# Patient Record
Sex: Female | Born: 1970
Health system: Western US, Academic
[De-identification: ages and names within clinical notes are randomized; demographics above are authoritative.]

---

## 2020-02-21 ENCOUNTER — Inpatient Hospital Stay: Payer: Self-pay

## 2020-02-21 DIAGNOSIS — M25561 Pain in right knee: Secondary | ICD-10-CM

## 2020-02-21 DIAGNOSIS — M25562 Pain in left knee: Secondary | ICD-10-CM

## 2020-02-21 DIAGNOSIS — G8929 Other chronic pain: Secondary | ICD-10-CM

## 2023-03-28 ENCOUNTER — Inpatient Hospital Stay
Admit: 2023-03-28 | Discharge: 2023-03-29 | Disposition: A | Discharge status: Nursing Home (Includes ICF) | Payer: BLUE CROSS/BLUE SHIELD | Source: Home / Self Care

## 2023-03-28 ENCOUNTER — Ambulatory Visit: Payer: BLUE CROSS/BLUE SHIELD

## 2023-03-28 DIAGNOSIS — N12 Tubulo-interstitial nephritis, not specified as acute or chronic: Secondary | ICD-10-CM

## 2023-03-28 LAB — Glucose,POC: GLUCOSE,POC: 100 mg/dL — ABNORMAL HIGH (ref 65–99)

## 2023-03-28 LAB — Glucose, Whole Blood: GLUCOSE, WHOLE BLOOD: 96 mg/dL (ref 65–99)

## 2023-03-28 LAB — CBC: HEMOGLOBIN: 11.8 g/dL (ref 11.6–15.2)

## 2023-03-28 LAB — CREATININE: ESTIMATED GFR 2021 CKD-EPI: 59 mL/min/{1.73_m2} (ref 0.60–1.30)

## 2023-03-28 MED ADMIN — SODIUM CHLORIDE 0.9 % IV BOLUS: 1000 mL | INTRAVENOUS | Stop: 2023-03-29 | NDC 00338004904

## 2023-03-28 MED ADMIN — ONDANSETRON 4 MG PO TBDP: 4 mg | ORAL | @ 23:00:00 | Stop: 2023-03-28 | NDC 57237007710

## 2023-03-28 NOTE — ED Provider Notes
[facility]  Emergency Department Service Report    Triage     Jhane Vanderzwaag, a 52 y.o. female, presents with Emesis (For a month)    Arrived on 03/28/2023 at 3:02 PM   Arrived by Walk-in [14]    ED Triage Vitals   Temp Temp Source BP Heart Rate Resp SpO2 O2 Device Pain Score Weight   03/28/23 1506 03/28/23 1506 03/28/23 1506 03/28/23 1506 03/28/23 1506 03/28/23 1506 03/29/23 0027 03/28/23 1506 03/28/23 1508   36.7 ?C (98.1 ?F) Oral 123/74 (!) 109 16 98 % None (Room air) Five 94.3 kg (208 lb)       No Known Allergies     Initial Physician Contact       Comprehensive Exam Initiated  Contact Date: 03/28/23  Contact Time: 52    History   52 year old female with a history of cholecystectomy presenting with abdominal pain, vomiting and fatigue.  The patient states the abdominal pain and vomiting started 1 month ago.  She was seen at an outside hospital and diagnosed with the UTI.  She finished her antibiotics and has only felt worse.  Now she has extreme fatigue and diarrhea associated with this.  She has subjective fevers as well.  Patient denies chest pain, shortness of breath.    The history is provided by the patient, medical records and a relative.       Financial risk analyst Used?: Patient declined (daughter interpreting for patient)               Past Medical History:   Diagnosis Date    Hypertension         Past Surgical History:   Procedure Laterality Date    CHOLECYSTECTOMY          Past Family History   family history is not on file.     Past Social History   she has no history on file for tobacco use, alcohol use, drug use, and sexual activity.       Physical Exam   Physical Exam  Vitals and nursing note reviewed.   Constitutional:       Appearance: Normal appearance.   HENT:      Head: Normocephalic and atraumatic.      Nose: Nose normal.      Mouth/Throat:      Mouth: Mucous membranes are moist.      Pharynx: Oropharynx is clear.   Eyes:      Extraocular Movements: Extraocular movements intact.      Conjunctiva/sclera: Conjunctivae normal.      Pupils: Pupils are equal, round, and reactive to light.   Cardiovascular:      Rate and Rhythm: Normal rate and regular rhythm.      Pulses: Normal pulses.      Heart sounds: Normal heart sounds.   Pulmonary:      Effort: Pulmonary effort is normal. No respiratory distress.      Breath sounds: Normal breath sounds.   Abdominal:      General: Abdomen is flat. Bowel sounds are normal.      Palpations: Abdomen is soft.      Tenderness: There is abdominal tenderness (Diffuse). There is no guarding or rebound.   Musculoskeletal:         General: Normal range of motion.      Cervical back: Normal range of motion.      Right lower leg: No edema.      Left lower  leg: No edema.   Skin:     General: Skin is warm and dry.      Capillary Refill: Capillary refill takes less than 2 seconds.      Findings: Rash (bilateral upper extremities) present.   Neurological:      General: No focal deficit present.      Mental Status: She is alert and oriented to person, place, and time.   Psychiatric:         Mood and Affect: Mood normal.         Behavior: Behavior normal.           Medical Decision Making   Jaemarie Kovatch is a 52 y.o. female subacute abdominal pain, vomiting. Differential diagnosis includes but is not limited to:  Gastritis, viral infection, persistent UTI, colitis, diverticulitis, atypical ACS, arrhythmia, drug reaction, electrolyte abnormality, dehydration.  Plan:  Labs, urinalysis, CT abdomen pelvis, EKG, IV fluids, symptom control.    Medical Decision Making  Amount and/or Complexity of Data Reviewed  Labs: ordered. Decision-making details documented in ED Course.  Radiology: ordered.  ECG/medicine tests: ordered.    Risk  Prescription drug management.  Decision regarding hospitalization.        ED Course as of 04/02/23 2336   Sun Mar 28, 2023   1707 1652 EKG normal sinus rhythm, rate 92, normal intervals, no STE/D, no TWI, inferior Q waves [SK]   1725 Potassium(!): 3.4  Repletion ordered.  [SK]   2118 IMPRESSION:     1.  No acute CT abnormality in the abdomen or pelvis. Normal appendix. No bowel obstruction.  2.  Hepatic steatosis.   [LC]   Mon Mar 29, 2023   0530 UA consistent with pyelonephritis. Failed treatment with oral antibiotics. Persistently tachycardic. Plan to transfer for inpatient admission at OSH.  [LC]   0555 Pending transfer to OSH. Here w/ pyelo. Transfer cert in.  [KM]      ED Course User Index  [KM] Unknown Jim., MD  [LC] Cresenciano Lick., MD  [SK] Sydnee Cabal., MD, MPH       ED Course      Laboratory Results     Labs Reviewed   UA,DIPSTICK - Abnormal; Notable for the following components:       Result Value    Specific Gravity 1.043 (*)     Ketones 4+ (*)     Protein Trace (*)     Leukocyte Esterase 3+ (*)     All other components within normal limits   UA,MICROSCOPIC - Abnormal; Notable for the following components:    WBC per uL >1,000 (*)     WBC per HPF >200 (*)     Bacteria Present (*)     Squamous Epi Cells 222 (*)     All other components within normal limits   CBC - Abnormal; Notable for the following components:    Mean Corpuscular Volume 78.8 (*)     Mean Corpuscular Hemoglobin 24.8 (*)     Red Cell Distribution Width-SD 53.0 (*)     Red Cell Distribution Width-CV 18.6 (*)     All other components within normal limits   ELECTROLYTE PANEL - Abnormal; Notable for the following components:    Sodium 132 (*)     Potassium 3.4 (*)     Chloride 92 (*)     All other components within normal limits   HEPATIC FUNCT PANEL - Abnormal; Notable for the following components:  Albumin 3.8 (*)     All other components within normal limits   UA,DIPSTICK - Abnormal; Notable for the following components:    Specific Gravity 1.054 (*)     Ketones 4+ (*)     Protein Trace (*)     Leukocyte Esterase 1+ (*)     All other components within normal limits   UA,MICROSCOPIC - Abnormal; Notable for the following components: WBC per uL 65 (*)     WBC per HPF 12 (*)     Bacteria Present (*)     Squamous Epi Cells 30 (*)     All other components within normal limits   POCT GLUCOSE - Abnormal; Notable for the following components:    Glucose, Point of Care 100 (*)     All other components within normal limits   PREGNANCY TEST,URINE - Normal   GLUCOSE - Normal   LIPASE - Normal   TSH WITH REFLEX FT4, FT3 - Normal   HS TROPONIN I + REFLEX IF >= 5 NG/L - Normal   PREGNANCY TEST,BLOOD - Normal   EXPEDITED COVID-19 AND INFLUENZA A B PCR, RESPIRATORY UPPER   URINALYSIS,ROUTINE    Narrative:     The following orders were created for panel order Urinalysis Routine (ED Abdominal Pain/Nausea/Vomiting: Nurse Protocol).  Procedure                               Abnormality         Status                     ---------                               -----------         ------                     UA,Dipstick[[id number]]                  Abnormal            Final result               UA,Microscopic[[id number]]               Abnormal            Final result                 Please view results for these tests on the individual orders.   CREATININE,WHOLE BLOOD   URINALYSIS,ROUTINE    Narrative:     The following orders were created for panel order Urinalysis (UA).  Procedure                               Abnormality         Status                     ---------                               -----------         ------                     UA,Dipstick[[id number]]  Abnormal            Final result               UA,Microscopic[[id number]]               Abnormal            Final result                 Please view results for these tests on the individual orders.       Imaging Results     CT abd+pelvis w contrast   Final Result by Ledon Snare., DO (06/09 2039)   IMPRESSION:      1.  No acute CT abnormality in the abdomen or pelvis. Normal appendix. No bowel obstruction.   2.  Hepatic steatosis.            Signed by: Ledon Snare   03/28/2023 8:39 PM Consults     Consult Orders Placed This Encounter       None            Clnical Impression        1. Pyelonephritis           Disposition and Follow-up   Disposition: Transfer to Another Facility [2]     No future appointments.    Follow up with:  No follow-up provider specified.    Return precautions are specified on After Visit Summary.    Discharge Medication List as of 03/29/2023  7:36 AM          Medications Administered This Encounter           Status .     cefTRIAXone 1 g in dextrose 50 mL IVPB MB  STAT         Last MAR action: Stopped      sodium chloride 0.9% IV soln bolus 1,000 mL  Once         Last MAR action: Stopped      ondansetron 4 mg/2 mL inj 4 mg  Once         Last MAR action: Given      iohexol (Omnipaque) 350 mg/mL inj 100 mL  Once         Last MAR action: Given      sodium chloride 0.9% IV soln bolus 1,000 mL  Once         Last MAR action: Stopped      ondansetron ODT tab disolv 4 mg  Once         Last MAR action: Given             Orthoptist   I, Earlean Polka , have acted as a Stage manager for patient Dell Debs on behalf of Dr. Adriana Simas at 03/29/2023 at 1:18 AM. All documentation underwent a comprehensive review by the listed physician(s) and received their approval upon signing.      Resident Signature        Cresenciano Lick., MD  Resident  03/29/23 0532    ATTENDING NOTE    I was present with the resident during the key/critical portions of this service.  I have discussed the management with the resident, have reviewed the resident note and agree with the documented findings and plan of care.    Redge Gainer, MD      Sydnee Cabal., MD, MPH  04/02/23 [phone]

## 2023-03-28 NOTE — ED Notes
Carpal spasms have resolved, RR decreased, pt medicated with Zofran ODT. Awaits ERMD eval.

## 2023-03-28 NOTE — ED Notes
Family called reporting pt feeling short of breath. First contact with pt , pt hyperventilating with carpal spasm on left hand.  Coaching done to help slow down breathing. Pt speaking in full sentences, Pt noted to have rash to bilateral upper arms x 2 weeks. Pt dry heaving. No chest pain or other pain. Family with pt.

## 2023-03-29 LAB — UA,Dipstick: PH,URINE: 7 (ref 5.0–8.0)

## 2023-03-29 LAB — UA,Microscopic
RBCS: 11 {cells}/uL (ref 0–11)
SQUAMOUS EPITHELIAL CELLS: 30 {cells}/uL — ABNORMAL HIGH (ref 0–17)

## 2023-03-29 LAB — Expedited COVID-19 and Influenza A B PCR: INFLUENZA B PCR: NOT DETECTED

## 2023-03-29 LAB — Electrolyte Panel: POTASSIUM: 3.4 mmol/L — ABNORMAL LOW (ref 3.6–5.3)

## 2023-03-29 LAB — Lipase: LIPASE: 30 U/L (ref 13–69)

## 2023-03-29 LAB — TSH with reflex FT4, FT3: TSH: 1.1 u[IU]/mL (ref 0.3–4.7)

## 2023-03-29 LAB — Pregnancy Test,Blood: PREGNANCY TEST,BLOOD: NEGATIVE

## 2023-03-29 LAB — HS Troponin I + Reflex If >=  5 ng/L: HIGH SENSITIVITY TROPONIN I: 4 ng/L (ref <5)

## 2023-03-29 LAB — Hepatic Funct Panel: ALBUMIN FORHEPFUNCTPNL: 3.8 g/dL — ABNORMAL LOW (ref 3.9–5.0)

## 2023-03-29 LAB — Pregnancy Test,Urine: PREGNANCY TEST,URINE: NEGATIVE

## 2023-03-29 MED ADMIN — SODIUM CHLORIDE 0.9 % IV BOLUS: 1000 mL | INTRAVENOUS | @ 04:00:00 | Stop: 2023-03-29 | NDC 00338004904

## 2023-03-29 MED ADMIN — CEFTRIAXONE SODIUM-DEXTROSE 1-3.74 GM-%(50ML) IV SOLR: 1 g | INTRAVENOUS | @ 07:00:00 | Stop: 2023-03-29 | NDC 00264315311

## 2023-03-29 MED ADMIN — ONDANSETRON HCL 4 MG/2ML IJ SOLN: 4 mg | INTRAVENOUS | @ 04:00:00 | Stop: 2023-03-29 | NDC 60505613000

## 2023-03-29 MED ADMIN — IOHEXOL 350 MG/ML IV SOLN: 100 mL | INTRAVENOUS | @ 04:00:00 | Stop: 2023-03-29 | NDC 00407141491

## 2023-03-29 MED ADMIN — POTASSIUM CHLORIDE 20 MEQ/15ML (10%) PO SOLN: 40 meq | ORAL | @ 01:00:00 | Stop: 2023-03-29

## 2023-03-29 NOTE — Nursing Note
Report called to [facility] [place] [phone] ext [phone]. Report given to [facility]. ETA time for pick up 0500.    0500: New pick up time is now 0630-0700.

## 2023-03-29 NOTE — ED Notes
Did not physically received patient. RN Sheran Lawless will give report to [facility] RN.

## 2023-03-29 NOTE — Nursing Note
Pt picked up by EMT via stretcher. All belongings given back to pt. Pt is A&O x 4, on RA, respirations even and unlabored. Report given to EMT. Pt left in stable condition.   Last Recorded Vital Signs:    03/29/23 0725   BP: 118/59   Pulse: 96   Resp: 18   Temp: 36.5 ?C (97.7 ?F)   SpO2: 96%

## 2023-03-29 NOTE — Consults
Per ED MD, pt stable to transfer to in network facility   Spoke to [facility] regarding clinical information. Belinda to pass on to Dr. Verdie Mosher and call CM back.  Called Massie Bougie [phone]) regarding peer to peer. Belinda to follow up with Dr. Verdie Mosher and call CM back   Per Massie Bougie, Dr. Verdie Mosher attempted to do peer to peer but was unsuccessful. CM to reinitiate peer to peer   Peer to peer initiated with ED MD and Dr. Verdie Mosher [phone]). Per Dr. Verdie Mosher, pt capitated to [facility], pt accepted.   C

## 2024-09-14 IMAGING — MR JOELHO ESQUERDO
1 series · 10 of 10 positions shown · non-contrast
Comparison: none

[Series 10: [id number]_fs · left · 10 of 22 frames shown]
[frame 1/22]
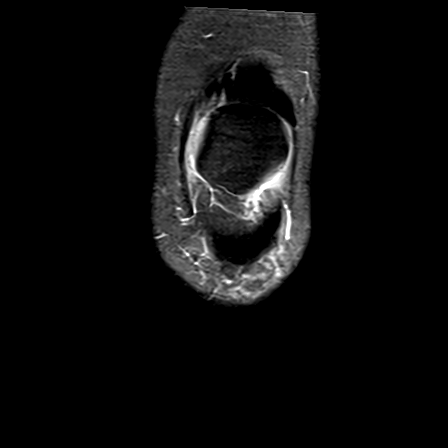
[frame 3/22]
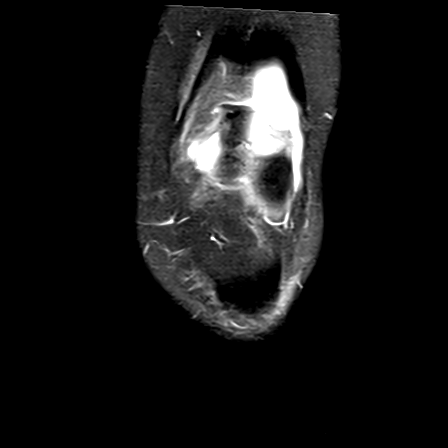
[frame 5/22]
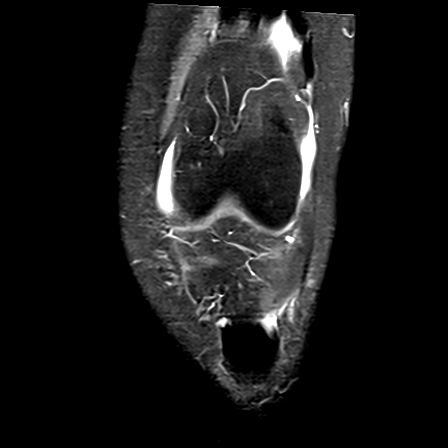
[frame 8/22]
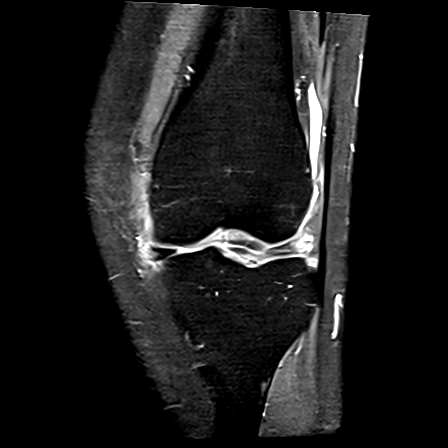
[frame 10/22]
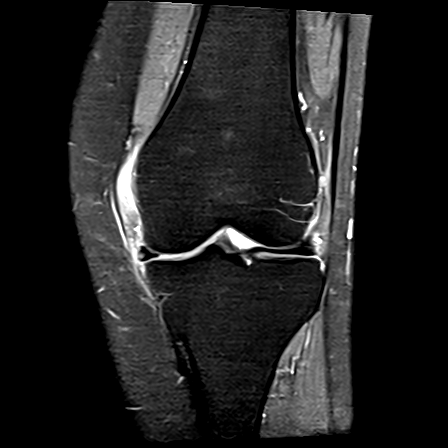
[frame 12/22]
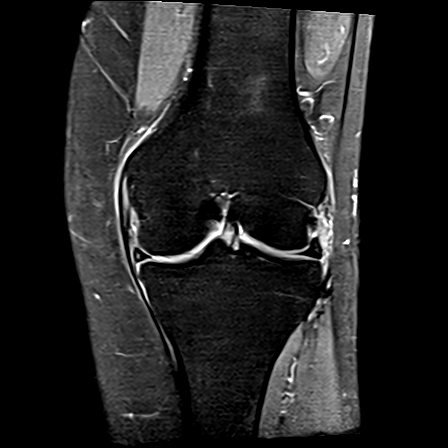
[frame 15/22]
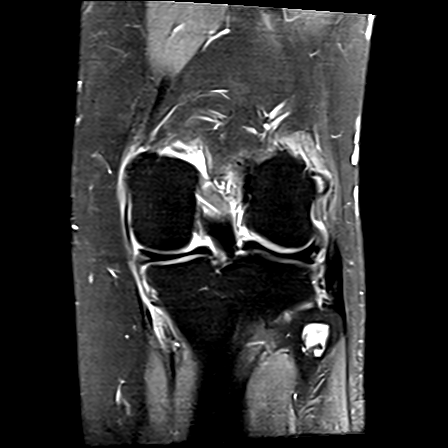
[frame 17/22]
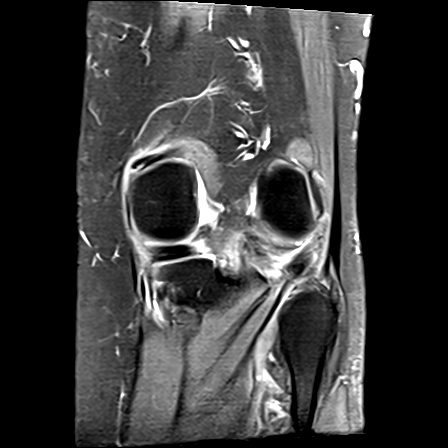
[frame 19/22]
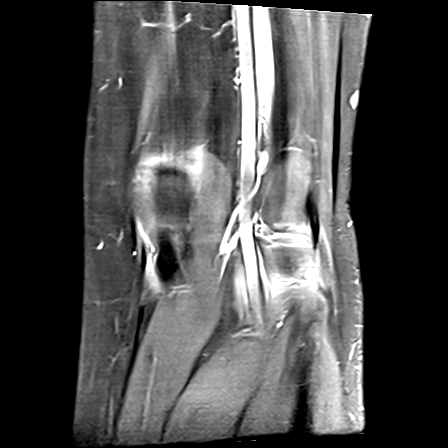
[frame 22/22]
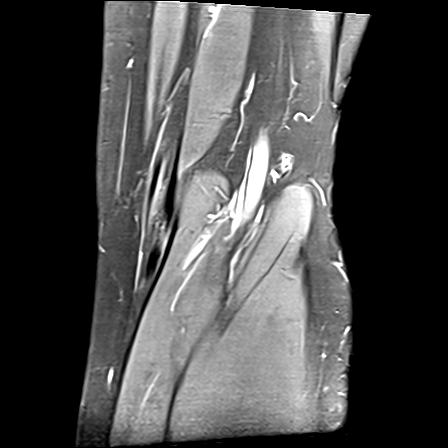

[10 of 10 positions shown; findings below may reference images not displayed]

Solicitante: OMOYINOLUWA TOBRE
Método:
Ressonância magnética realizada com sequências FSE em T1 e T2. Planos de cortes múltiplos.
Análise:
Sinais  de  estiramento  do  ligamento  colateral  medial  no  seu  terço  proximal,  com  alteração  de  sinal  e  edema
periligamentar difuso, sem roturas transﬁxantes.
Ligamentos cruzados e colateral lateral íntegros.
 Alteração degenerativa do corpo e corno posterior do menisco medial, sem roturas instáveis deﬁnidas.
RESSONÂNCIA MAGNÉTICA DO JOELHO ESQUERDO
Discreta alteração degenerativa difusa do menisco lateral, sem roturas instáveis deﬁnidas.
Superfícies condrais dos compartimentos femorotibiais e da tróclea femoral sem alterações signiﬁcativas.
Condropatia patelar caracterizada por alteração de sinal predominando nos terços médio e inferior das facetas, com
ﬁssuras profundas adjacentes, sem edema ósseo subcondral.
Leve edema da porção superolateral da gordura infrapatelar e que pode estar relacionado a atrito patelofemoral.
Discreta entesopatia insercional do quadríceps, sem roturas. Ligamento patelar íntegro.
Demais estruturas ósseas com morfologia e sinal normais.
Pequeno derrame articular.
 Fossa poplítea sem particularidades.
Leve edema do plano gorduroso interposto entre o trato iliotibial e o côndilo femoral lateral, por provável atrito.
Demais estruturas musculares e tendíneas conservadas.
Leve edema da tela subcutânea anterior, sem coleções.
IMPRESSÃO DIAGNÓSTICA:
Solicitante: OMOYINOLUWA TOBRE
Sinais de estiramento do ligamento colateral medial no seu terço proximal, sem roturas transﬁxantes.
 Alteração degenerativa difusa dos meniscos, sem roturas instáveis deﬁnidas.
Condropatia patelar, sem modiﬁcação subcondral. Associam-se sinais de discreto atrito na gordura infrapatelar.
Pequeno derrame articular.
Leve edema do plano gorduroso interposto entre o trato iliotibial e o côndilo femoral lateral, por provável atrito.
Demais achados descritos no corpo do laudo.

## 2024-09-14 IMAGING — MR JOELHO DIREITO
1 series · 10 of 10 positions shown · non-contrast
Comparison: none

[Series 1: [email or web address](person_name) · right · 10 of 79 frames shown]
[frame 1/79]
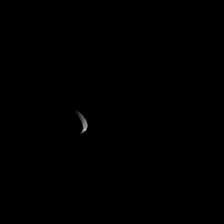
[frame 9/79]
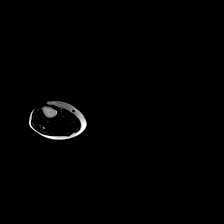
[frame 18/79]
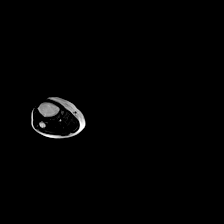
[frame 27/79]
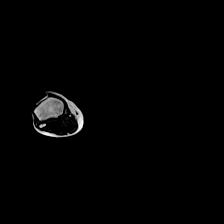
[frame 35/79]
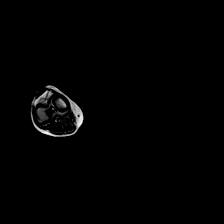
[frame 44/79]
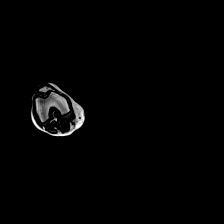
[frame 53/79]
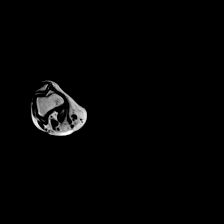
[frame 61/79]
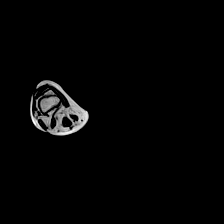
[frame 70/79]
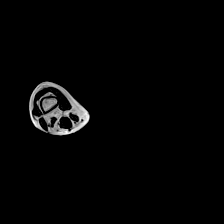
[frame 79/79]
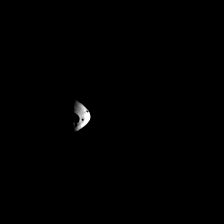

[10 of 10 positions shown; findings below may reference images not displayed]

Solicitante: LUCAS EDUARDO KARLOH
Técnica:
Exame realizado com sequências ponderadas em T1, T2-T2 com supressão de gordura.
Relatório:
Entesóﬁto degenerativo no polo superior da patela.
Pequeno derrame articular.
Aﬁlamento condral na faceta medial da patela, com erosão no terço médio, sem envolvimento ósseo subcondral.
Revestimentos condrais da tróclea femoral sem alterações.
RESSONÂNCIA MAGNÉTICA DO JOELHO DIREITO
Traço de hipersinal oblíquo na transição corpo para corno posterior do menisco medial, com irregularidades junto à
margem meniscocapsular posterior. Correlacionar com testes clínicos para avaliação de uma lesão nesta topograﬁa.
Aumento de sinal e irregularidade dos contornos no corpo e corno posterior do menisco lateral.
Tendinopatia do poplíteo, sem roturas.
Alteração de sinal e irregularidade na origem proximal do ligamento colateral lateral.
Ligamento colateral medial íntegro.
Ligamentos cruzados íntegros.
Leve edema da porção superolateral da gordura de [place], de provável origem mecânica.
Tendinopatia e paratendinite da cabeça medial do gastrocnêmio.
Tendinopatia do semimembranoso.
Lâmina líquida distendendo a bolsa entre o gastrocnêmio medial e o semimembranoso.
Demais estruturas ósseas sem alterações.
Impressão:
Solicitante: LUCAS EDUARDO KARLOH
Entesóﬁto degenerativo no polo superior da patela.
Pequeno derrame articular.
Condropatia patelar com aﬁlamento e erosão condral na faceta medial.
Traço de hipersinal oblíquo na transição corpo para corno posterior do menisco medial, com irregularidades
junto à margem meniscocapsular posterior. Correlacionar com testes clínicos para avaliação de uma lesão nesta
topograﬁa.
Degeneração do menisco lateral com aumento de sinal e irregularidade dos contornos no corpo.
Tendinopatia do poplíteo.
Sinais de injúria/lesão parcial na origem proximal do ligamento colateral lateral.
Tendinopatia e paratendinite da cabeça medial do gastrocnêmio.
Tendinopatia do semimembranoso.
